# Patient Record
Sex: Female | Born: 1990 | Race: Black or African American | Hispanic: No | Marital: Single | Smoking: Former smoker
Health system: Southern US, Community
[De-identification: ages and names within clinical notes are randomized; demographics above are authoritative.]

## PROBLEM LIST (undated history)

## (undated) HISTORY — PX: WISDOM TOOTH EXTRACTION: SHX21

---

## 2017-05-16 ENCOUNTER — Ambulatory Visit (HOSPITAL_COMMUNITY)
Admission: EM | Admit: 2017-05-16 | Discharge: 2017-05-16 | Disposition: A | Discharge status: Home / Self Care | Payer: Self-pay | Attending: Family Medicine | Admitting: Family Medicine

## 2017-05-16 ENCOUNTER — Encounter (HOSPITAL_COMMUNITY): Payer: Self-pay | Admitting: *Deleted

## 2017-05-16 DIAGNOSIS — Z87891 Personal history of nicotine dependence: Secondary | ICD-10-CM | POA: Insufficient documentation

## 2017-05-16 DIAGNOSIS — N939 Abnormal uterine and vaginal bleeding, unspecified: Secondary | ICD-10-CM | POA: Insufficient documentation

## 2017-05-16 LAB — POCT PREGNANCY, URINE: Preg Test, Ur: NEGATIVE

## 2017-05-16 MED ORDER — MEGESTROL ACETATE 40 MG PO TABS: 40.0000 mg | ORAL_TABLET | Freq: Two times a day (BID) | ORAL | 0 refills | Status: DC

## 2017-05-16 NOTE — ED Triage Notes (Signed)
C/O vaginal bleeding since 5/31 daily; on 6/5 started becoming heavier.  Denies any changes in birth control.  C/O occasional slight cramping.

## 2017-05-16 NOTE — ED Provider Notes (Signed)
CSN: [id number]     Arrival date & time 05/16/17  1939 History   None    Chief Complaint  Patient presents with  . Vaginal Bleeding   (Consider location/radiation/quality/duration/timing/severity/associated sxs/prior Treatment) C/o vaginal bleeding that is heavy for 14 days.  She is having to use a diaper to help stop bleeding.  She denies any pelvic pain.  She denies vaginal discharge.   The history is provided by the patient.  Vaginal Bleeding  Quality:  Dark red and clots Severity:  Severe Onset quality:  Gradual Duration:  14 days Timing:  Constant Progression:  Worsening Chronicity:  New Menstrual history:  Irregular Possible pregnancy: no     History reviewed. No pertinent past medical history. Past Surgical History:  Procedure Laterality Date  . WISDOM TOOTH EXTRACTION     No family history on file. Social History  Substance Use Topics  . Smoking status: Former Games developermoker  . Smokeless tobacco: Never Used  . Alcohol use No   OB History    No data available     Review of Systems  Constitutional: Negative.   HENT: Negative.   Eyes: Negative.   Respiratory: Negative.   Cardiovascular: Negative.   Gastrointestinal: Negative.   Endocrine: Negative.   Genitourinary: Positive for vaginal bleeding.  Musculoskeletal: Negative.   Allergic/Immunologic: Negative.   Neurological: Negative.   Hematological: Negative.   Psychiatric/Behavioral: Negative.     Allergies  Patient has no known allergies.  Home Medications   Prior to Admission medications   Medication Sig Start Date End Date Taking? Authorizing Provider  megestrol (MEGACE) 40 MG tablet Take 1 tablet (40 mg total) by mouth 2 (two) times daily. 05/16/17   Deatra Canterxford, Kruze Atchley J, FNP   Meds Ordered and Administered this Visit  Medications - No data to display  BP 116/71   Pulse (!) 58   Temp 98.5 F (36.9 C) (Oral)   Resp 16   LMP 05/02/2017 (Exact Date)   SpO2 100%  No data found.   Physical Exam   Constitutional: She appears well-developed and well-nourished.  HENT:  Head: Normocephalic and atraumatic.  Eyes: Conjunctivae and EOM are normal. Pupils are equal, round, and reactive to light.  Neck: Normal range of motion. Neck supple.  Cardiovascular: Normal rate, regular rhythm and normal heart sounds.   Pulmonary/Chest: Effort normal and breath sounds normal.  Abdominal: Soft. Bowel sounds are normal.  Genitourinary:  Genitourinary Comments: BUS - Wnl Vagina - blood in vault Cervix - Blood coming from OS No CMT or adnexal tenderness.  Nursing note and vitals reviewed.   Urgent Care Course     Procedures (including critical care time)  Labs Review Labs Reviewed  POCT PREGNANCY, URINE  CERVICOVAGINAL ANCILLARY ONLY    Imaging Review No results found.   Visual Acuity Review  Right Eye Distance:   Left Eye Distance:   Bilateral Distance:    Right Eye Near:   Left Eye Near:    Bilateral Near:         MDM   1. Vaginal bleeding    Megace 40mg  po bid x 10 days #20  [facility]  Endocervical Cytology GC/ Chlamydia trich BV and Candidiasis.      Deatra CanterOxford, Jaimie Pippins J, [place]NP 05/16/17 2041

## 2017-05-17 LAB — CERVICOVAGINAL ANCILLARY ONLY
Bacterial vaginitis: POSITIVE — AB
Candida vaginitis: NEGATIVE
Chlamydia: NEGATIVE
Neisseria Gonorrhea: NEGATIVE
Trichomonas: NEGATIVE

## 2017-05-22 ENCOUNTER — Other Ambulatory Visit: Payer: Self-pay | Admitting: *Deleted

## 2017-05-22 ENCOUNTER — Telehealth: Payer: Self-pay | Admitting: *Deleted

## 2017-05-22 DIAGNOSIS — N939 Abnormal uterine and vaginal bleeding, unspecified: Secondary | ICD-10-CM

## 2017-05-22 NOTE — Telephone Encounter (Signed)
Per Dr Macon LargeAnyanwu, scheduled pelvic u/s for 6/28 @ 0800. Patient was called and notified. Understanding voiced.

## 2017-05-23 ENCOUNTER — Telehealth (HOSPITAL_COMMUNITY): Payer: Self-pay | Admitting: Emergency Medicine

## 2017-05-23 MED ORDER — METRONIDAZOLE 500 MG PO TABS: 500.0000 mg | ORAL_TABLET | Freq: Two times a day (BID) | ORAL | 0 refills | Status: DC

## 2017-05-23 NOTE — Telephone Encounter (Signed)
Called pt and notified of recent lab results Pt ID'd properly... Reports feeling better but still having pelvic pain and slight vag d/c Pt would like for us to call in Flagyl to [facility] E-Rx med to pharmacy Adv pt if sx are not getting better to return or to f/u w/PCP Education on safe sex given Notified pt that lab results can be obtained through MyChart Pt verb understanding.

## 2017-05-23 NOTE — Telephone Encounter (Signed)
-----   Message from Eustace MooreLaura W Murray, MD sent at 05/19/2017 12:20 PM EDT ----- Please let patient know that test for gardnerella (bacterial vaginosis) was positive.  This only needs to be treated if there are symptoms, such as vaginal irritation/discharge.  If these symptoms are present, ok to send rx for metronidazole 500mg  bid x 7d #14 no refills or metronidazole vaginal gel 0.75% 1 applicatorful bid x 7d #14 no refills.  [facility] as discussed at urgent care visit 6/14, for menorrhagia.  LM

## 2017-05-30 ENCOUNTER — Ambulatory Visit (HOSPITAL_COMMUNITY)
Admission: RE | Admit: 2017-05-30 | Discharge: 2017-05-30 | Disposition: A | Discharge status: Home / Self Care | Payer: Self-pay | Source: Ambulatory Visit | Attending: Obstetrics & Gynecology | Admitting: Obstetrics & Gynecology

## 2017-05-30 DIAGNOSIS — N939 Abnormal uterine and vaginal bleeding, unspecified: Secondary | ICD-10-CM | POA: Insufficient documentation

## 2017-05-31 ENCOUNTER — Telehealth: Payer: Self-pay | Admitting: General Practice

## 2017-05-31 NOTE — Telephone Encounter (Signed)
Called patient, no answer- left message to call us back concerning non urgent results. Will send letter

## 2017-05-31 NOTE — Telephone Encounter (Signed)
-----   Message from Tereso NewcomerUgonna A Anyanwu, MD sent at 05/30/2017 10:50 AM EDT ----- Normal pelvic ultrasound. Will discuss management of her AUB with Dr. Alysia PennaErvin on 06/13/17 appointment. Please call to inform patient of results and recommendations.

## 2017-06-13 ENCOUNTER — Ambulatory Visit (INDEPENDENT_AMBULATORY_CARE_PROVIDER_SITE_OTHER): Payer: Self-pay | Admitting: Obstetrics and Gynecology

## 2017-06-13 VITALS — BP 124/69 | HR 69 | Wt 226.0 lb

## 2017-06-13 DIAGNOSIS — N920 Excessive and frequent menstruation with regular cycle: Secondary | ICD-10-CM

## 2017-06-13 DIAGNOSIS — F329 Major depressive disorder, single episode, unspecified: Secondary | ICD-10-CM

## 2017-06-13 DIAGNOSIS — F419 Anxiety disorder, unspecified: Secondary | ICD-10-CM

## 2017-06-13 MED ORDER — DESOGESTREL-ETHINYL ESTRADIOL 0.15-30 MG-MCG PO TABS: 1.0000 | ORAL_TABLET | Freq: Every day | ORAL | 5 refills | Status: AC

## 2017-06-13 NOTE — BH Specialist Note (Deleted)
Integrated Behavioral Health Initial Visit  MRN: [id number] Name: Tara Palmer   Session Start time: 1:*** Session End time: 2:*** Total time: {IBH Total Time:21014050}  Type of Service: Integrated Behavioral Health- Individual/Family Interpretor:No. Interpretor Name and Language: n/a   Warm Hand Off Completed.       SUBJECTIVE: Tara Palmer is a 26 y.o. female accompanied by patient. Patient was referred by Dr Alysia PennaErvin for depression, anxiety. Patient reports the following symptoms/concerns: *** Duration of problem: ***; Severity of problem: severe  OBJECTIVE: Mood: {BHH MOOD:22306} and Affect: {BHH AFFECT:22307} Risk of harm to self or others: {CHL AMB BH Suicide Current Mental Status:21022748}   LIFE CONTEXT: Family and Social: *** School/Work: *** Self-Care: *** Life Changes: ***  GOALS ADDRESSED: Patient will reduce symptoms of: {IBH Symptoms:21014056} and increase knowledge and/or ability of: {IBH Patient Tools:21014057} and also: {IBH Goals:21014053}   INTERVENTIONS: {IBH Interventions:21014054}  Standardized Assessments completed: {IBH Screening Tools:21014051}  ASSESSMENT: Patient currently experiencing ***. Patient may benefit from ***.  PLAN: 1. Follow up with behavioral health clinician on : *** 2. Behavioral recommendations: *** 3. Referral(s): {IBH Referrals:21014055} 4. "From scale of 1-10, how likely are you to follow plan?": ***  Rae LipsJamie C McMannes, LCSWA  Dep[facility] screen [facility] 06/13/2017  Decreased Interest 3  Down, Depressed, Hopeless 3  PHQ - 2 Score 6  Altered sleeping 3  Tired, decreased energy 3  Change in appetite 3  Feeling bad or failure about yourself  3  Trouble concentrating 3  Moving slowly or fidgety/restless 0  Suicidal thoughts 0  PHQ-9 Score 21   GAD 7 : Generalized Anxiety Score 06/13/2017  Nervous, Anxious, on Edge 3  Control/stop worrying 3  Worry too much - different things 3  Trouble relaxing 3  Restless 3   Easily annoyed or irritable 3  Afraid - awful might happen 3  Total GAD 7 Score 21

## 2017-06-13 NOTE — Progress Notes (Signed)
History of Present Illness   Patient Identification Tara Palmer is a [age] y.o. female.  Patient information was obtained from patient. History/Exam limitations: none.  Chief Complaint  New Patient (Initial Visit) (US RESULTS)  Tara Palmer is presenting to follow up on an US performed due to AUB. US was normal and showed normal enometrium w/o evidence of fibroids.  Since her visit to the ER her bleeding has not changed she is still having heavy flow and passing clots, the symptoms have not improved but also haven't worsened. She is currently using a baby diaper as pads are not enough to contain the flow. She uses 3 baby diapers per day and they are soaked. Bleeding everyday with heavier flow on some days than others.  Denies dysmenorrhea, and doesn't usually have cramps with periods.  There have been no breaks in flow for the duration of the bleeding episode. She reports fatigue over the last month. She reports that her UPT at ER was negative. The megace prescribed for the bleeding made her fell worse and made the bleeding worse. She tried the megace for 3 days. Denies personal or family hx of these symptoms. Has FHx of fobroids She reports felling hot often. She thought she was having hot flashes. No family of thyroid problems. She has recently lost 60lbs over 16 mo (11/2015-01/2017) as she is prediabetic. Cycles remained regular during this. She smokes cigars  Previous cycles: had regular monthly cycles q28 days lasting 5-7 days  Menarrche: age 26 Bleeding began May 17th 2018 G/P status: G0P0 Type of birth control used - not using any including condoms or coitus interruptus  Taken [employer] Company[employer]'s-no Sexually active- Yes Trying to get pregnant   Review of Systems  Constitutional: Positive for diaphoresis and malaise/fatigue. Negative for chills, fever and weight loss.  Eyes: Positive for photophobia.  Respiratory: Negative.   Cardiovascular: Positive for orthopnea. Negative for chest pain,  palpitations and leg swelling.  Gastrointestinal: Negative.   Genitourinary: Negative.   Musculoskeletal: Negative.   Skin: Negative.   Neurological: Positive for tingling, tremors and weakness.  Endo/Heme/Allergies: Negative.   Psychiatric/Behavioral: Positive for depression. Negative for hallucinations, memory loss, substance abuse and suicidal ideas. The patient is nervous/anxious and has insomnia.      Physical Exam   BP 124/69   Pulse 69   Wt 226 lb (102.5 kg)  General:   alert and cooperative  Heart: regular rate and rhythm, S1, S2 normal, no murmur, click, rub or gallop  Lungs: clear to auscultation bilaterally  Abdomen: soft, non-tender, without masses or organomegaly  Pelvic:    Vulva: normal   Vagina:  normal mucosa   Cervix: Bleeding noted   Uterus: normal size   Adnexa: normal adnexa    Orthostatic BP  Sitting: 131/79 pulse 68 Standing 145/72 pulse 71   Assessment and Plan    [name] is presenting with AUB for 2 months. Current ddx included AUB-I, vs thyroid cause. Given hx and previously normal cycles she likely has a hormonal cause of AUB. US rules out structural cause of AUB. -Options to control bleeding were reviewed with the pt and she opted to use OCP's to control bleeding with the intent to discontinue after bleeding is controlled to resume efforts to become pregnant.  -TSH performed to look for derangements  -CBC will be checked to look for anemia  -pt was advised to begin prenatal vitamins for Fe and folate.

## 2017-06-13 NOTE — Patient Instructions (Signed)
Dysfunctional Uterine Bleeding °Dysfunctional uterine bleeding is abnormal bleeding from the uterus. Dysfunctional uterine bleeding includes: °· A period that comes earlier or later than usual. °· A period that is lighter, heavier, or has blood clots. °· Bleeding between periods. °· Skipping one or more periods. °· Bleeding after sexual intercourse. °· Bleeding after menopause. ° °Follow these instructions at home: °Pay attention to any changes in your symptoms. Follow these instructions to help with your condition: °Eating and drinking °· Eat well-balanced meals. Include foods that are high in iron, such as liver, meat, shellfish, green leafy vegetables, and eggs. °· If you become constipated: °? Drink plenty of water. °? Eat fruits and vegetables that are high in water and fiber, such as spinach, carrots, raspberries, apples, and mango. °Medicines °· Take over-the-counter and prescription medicines only as told by your health care provider. °· Do not change medicines without talking with your health care provider. °· Aspirin or medicines that contain aspirin may make the bleeding worse. Do not take those medicines: °? During the week before your period. °? During your period. °· If you were prescribed iron pills, take them as told by your health care provider. Iron pills help to replace iron that your body loses because of this condition. °Activity °· If you need to change your sanitary pad or tampon more than one time every 2 hours: °? Lie in bed with your feet raised (elevated). °? Place a cold pack on your lower abdomen. °? Rest as much as possible until the bleeding stops or slows down. °· Do not try to lose weight until the bleeding has stopped and your blood iron level is back to normal. °Other Instructions °· For two months, write down: °? When your period starts. °? When your period ends. °? When any abnormal bleeding occurs. °? What problems you notice. °· Keep all follow up visits as told by your health  care provider. This is important. °Contact a health care provider if: °· You get light-headed or weak. °· You have nausea and vomiting. °· You cannot eat or drink without vomiting. °· You feel dizzy or have diarrhea while you are taking medicines. °· You are taking birth control pills or hormones, and you want to change them or stop taking them. °Get help right away if: °· You develop a fever or chills. °· You need to change your sanitary pad or tampon more than one time per hour. °· Your bleeding becomes heavier, or your flow contains clots more often. °· You develop pain in your abdomen. °· You lose consciousness. °· You develop a rash. °This information is not intended to replace advice given to you by your health care provider. Make sure you discuss any questions you have with your health care provider. °Document Released: 11/16/2000 Document Revised: 04/26/2016 Document Reviewed: 02/14/2015 °Elsevier Interactive Patient Education © 2018 Elsevier Inc. ° °

## 2017-06-14 LAB — CBC
Hematocrit: 35.8 % (ref 34.0–46.6)
Hemoglobin: 11 g/dL — ABNORMAL LOW (ref 11.1–15.9)
MCH: 23.2 pg — ABNORMAL LOW (ref 26.6–33.0)
MCHC: 30.7 g/dL — ABNORMAL LOW (ref 31.5–35.7)
MCV: 76 fL — ABNORMAL LOW (ref 79–97)
Platelets: 280 10*3/uL (ref 150–379)
RBC: 4.74 x10E6/uL (ref 3.77–5.28)
RDW: 15.5 % — ABNORMAL HIGH (ref 12.3–15.4)
WBC: 8.8 10*3/uL (ref 3.4–10.8)

## 2017-06-14 LAB — TSH: TSH: 1.64 u[IU]/mL (ref 0.450–4.500)

## 2017-10-04 IMAGING — US US PELVIS COMPLETE
1 series · 15 of 25 positions shown · non-contrast
Comparison: None

CLINICAL DATA: Abnormal uterine bleeding for 1 month.  Pelvic pain.

EXAM:
TRANSABDOMINAL AND TRANSVAGINAL ULTRASOUND OF PELVIS
TECHNIQUE: Both transabdominal and transvaginal ultrasound examinations of the
pelvis were performed. Transabdominal technique was performed for
global imaging of the pelvis including uterus, ovaries, adnexal
regions, and pelvic cul-de-sac. It was necessary to proceed with
endovaginal exam following the transabdominal exam to visualize the
uterus, endometrium, ovaries and adnexa .

[Series 1: us pelvis complete · 15 of 79 slices shown]
[im 1/79]
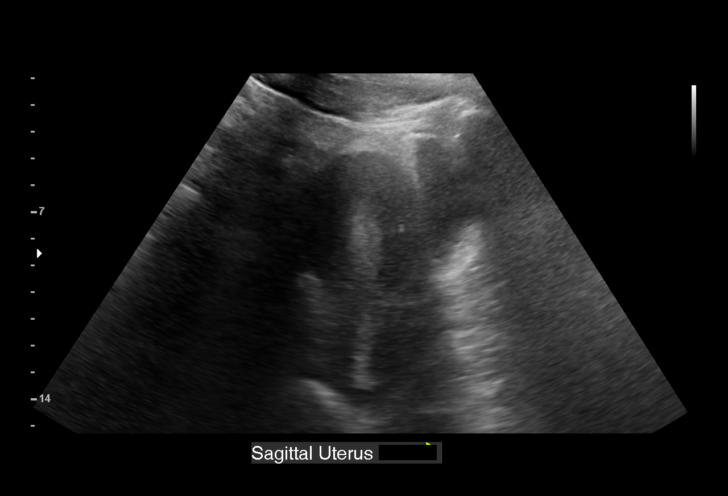
[im 7/79]
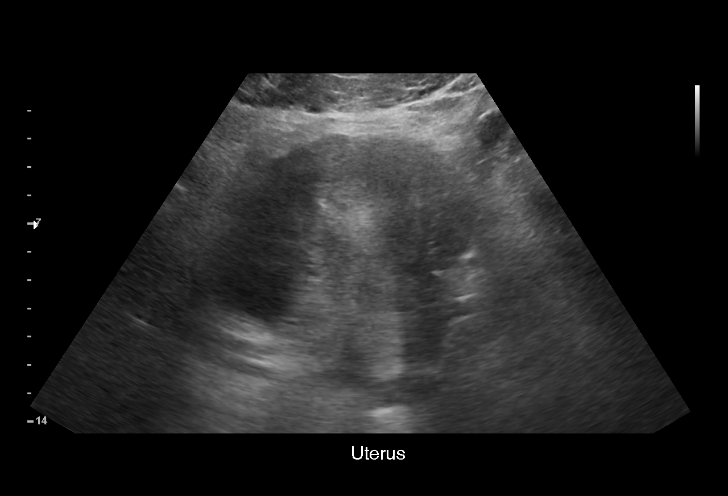
[im 14/79]
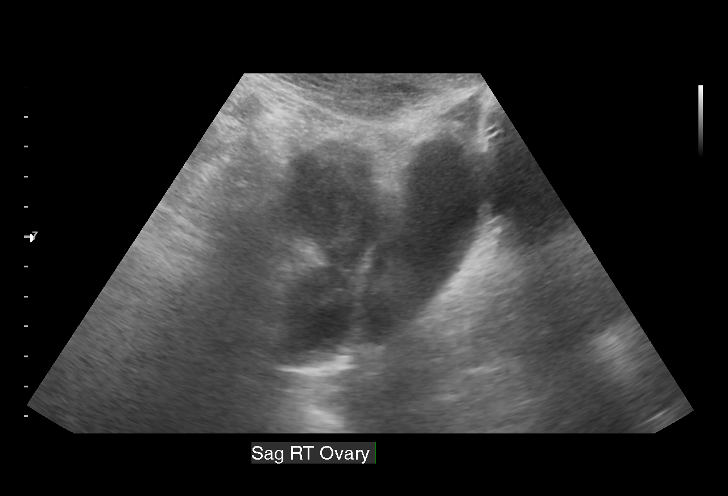
[im 17/79]
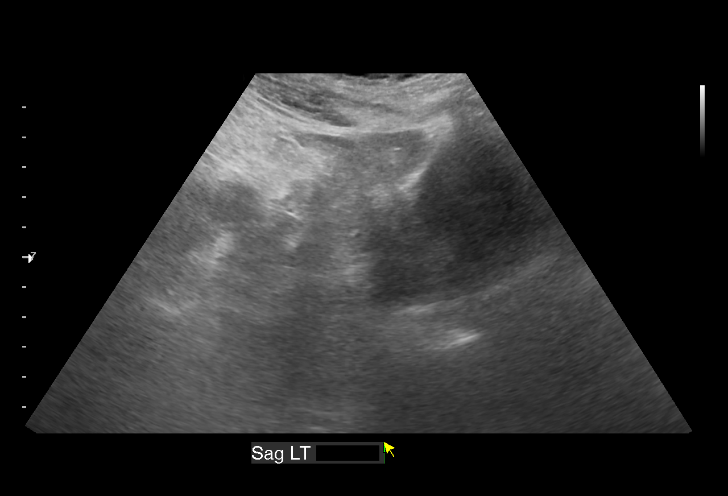
[im 23/79]
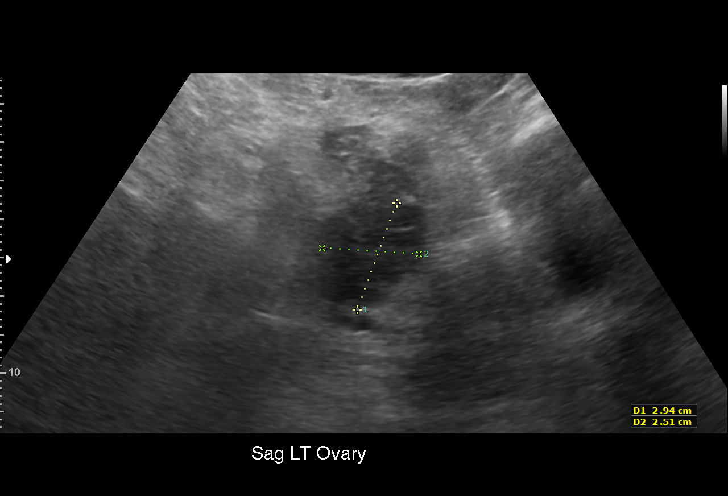
[im 30/79]
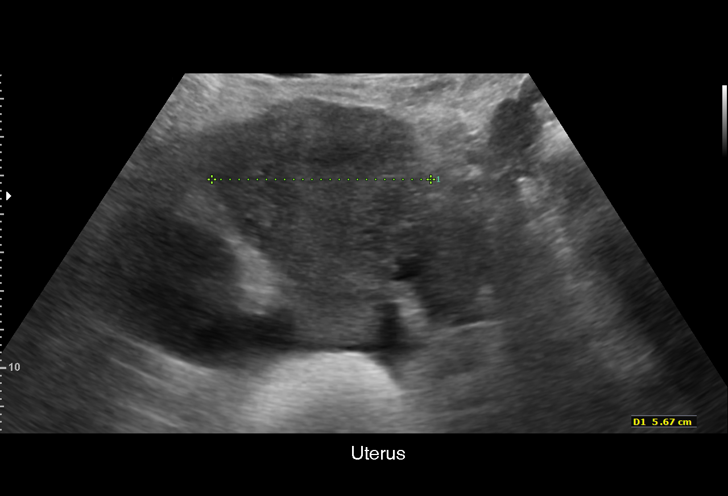
[im 33/79]
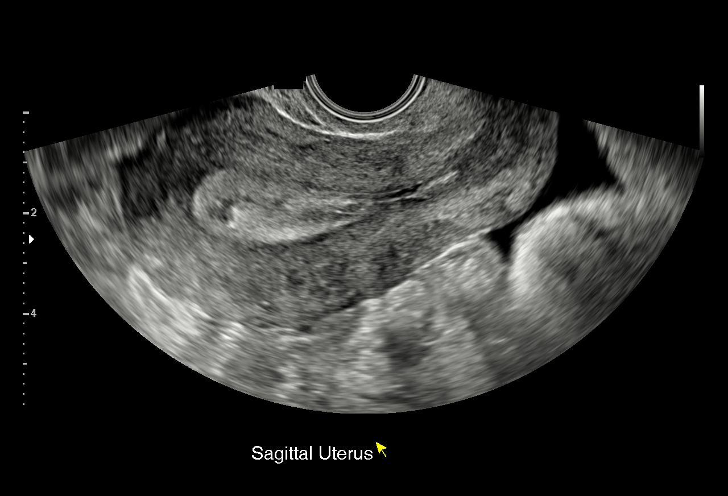
[im 40/79]
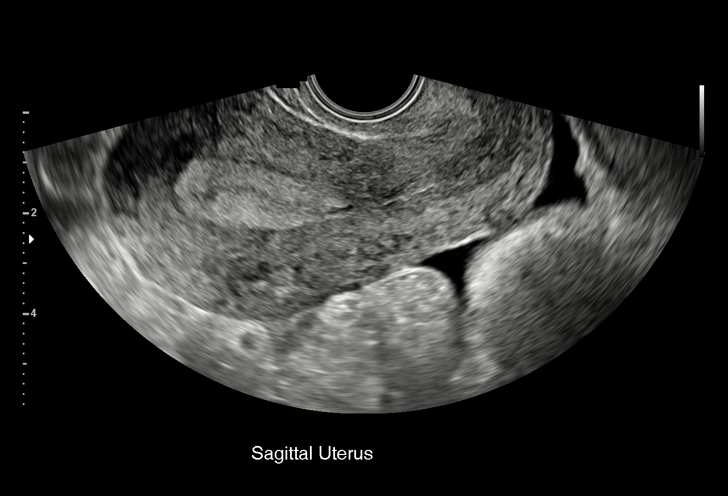
[im 46/79]
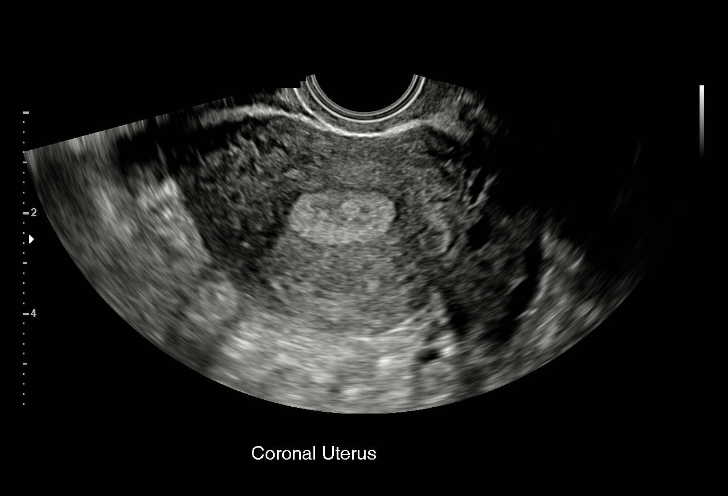
[im 49/79]
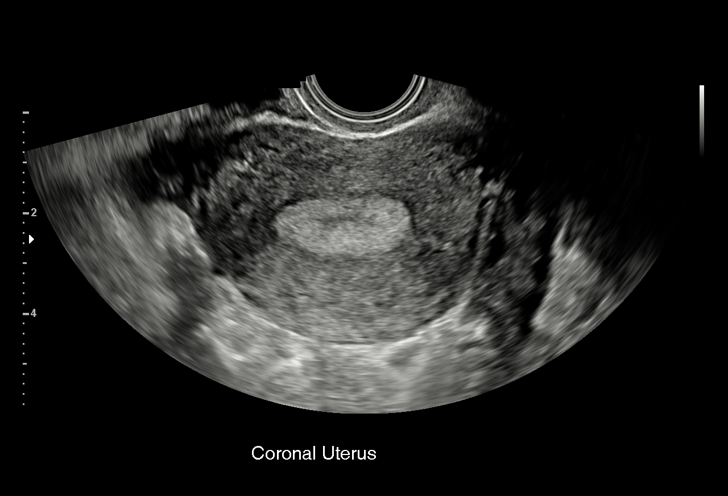
[im 56/79]
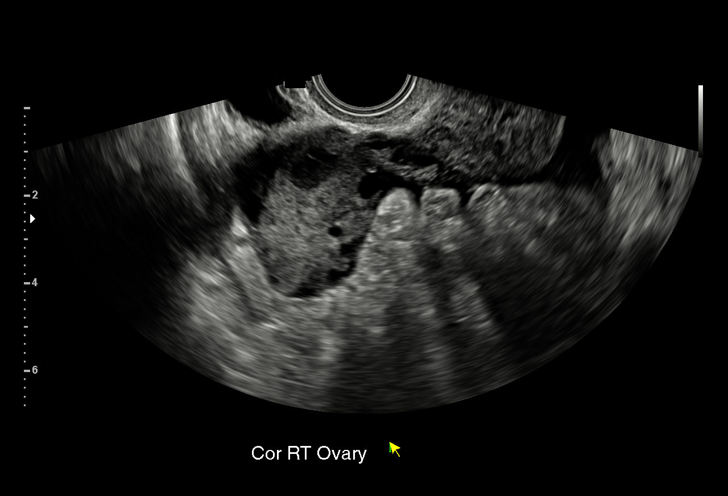
[im 62/79]
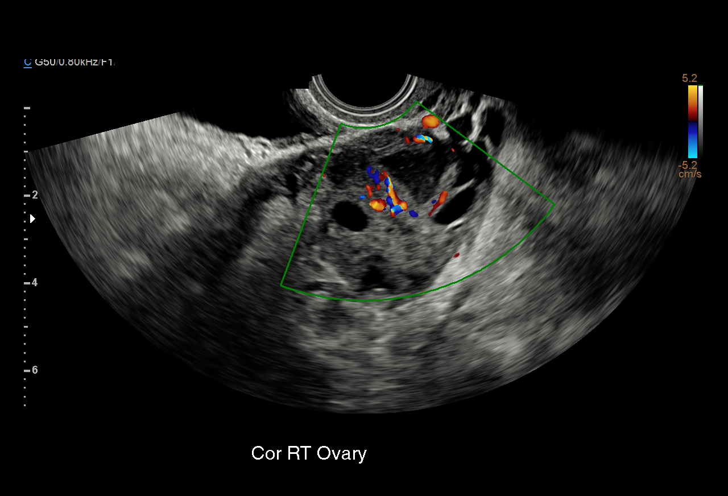
[im 66/79]
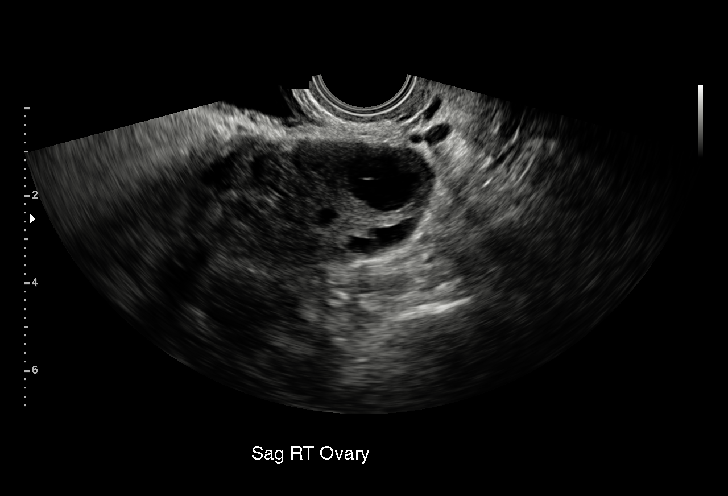
[im 72/79]
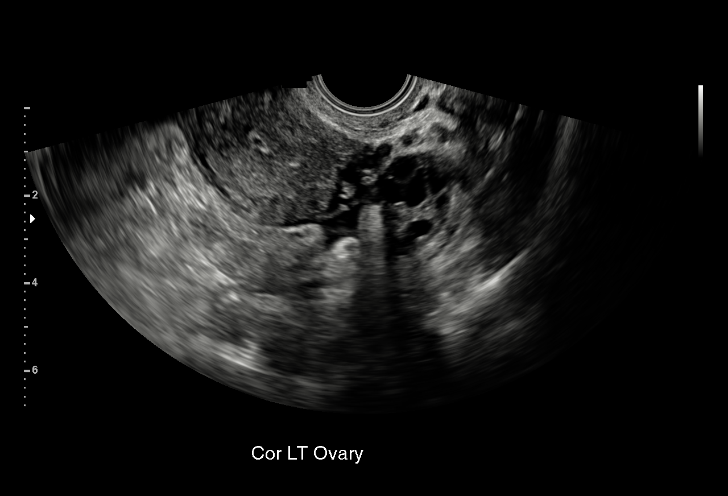
[im 79/79]
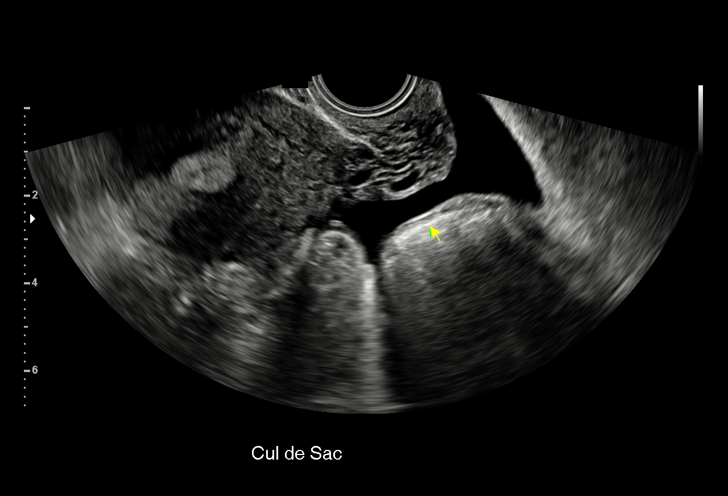

[15 of 25 positions shown; findings below may reference images not displayed]

FINDINGS: Uterus

Measurements: 9.6 x 4.6 x 6.1 cm. No fibroids or other mass
visualized.

Endometrium

Thickness: 14 mm in thickness.  No focal abnormality visualized.

Right ovary

Measurements: 3.9 x 4.1 x 3.4 cm. Normal appearance/no adnexal mass.

Left ovary

Measurements: 3.9 x 2.6 x 2.0 cm. Normal appearance/no adnexal mass.

Other findings

No abnormal free fluid.
IMPRESSION: Unremarkable pelvic ultrasound. If bleeding remains unresponsive to
hormonal or medical therapy, sonohysterogram should be considered
for focal lesion work-up. (Ref: Radiological Reasoning: Algorithmic
Workup of Abnormal Vaginal Bleeding with Endovaginal Sonography and
Sonohysterography. AJR 5225; 191:S68-73)

## 2021-03-01 ENCOUNTER — Ambulatory Visit (HOSPITAL_COMMUNITY)
Admission: EM | Admit: 2021-03-01 | Discharge: 2021-03-01 | Disposition: A | Discharge status: Home / Self Care | Payer: Self-pay

## 2021-03-01 ENCOUNTER — Other Ambulatory Visit: Payer: Self-pay

## 2021-03-01 NOTE — ED Notes (Signed)
Per registration staff, patient decided not to stay for visit.

## 2021-03-02 ENCOUNTER — Ambulatory Visit (HOSPITAL_COMMUNITY): Payer: Self-pay

## 2021-03-08 ENCOUNTER — Encounter (HOSPITAL_COMMUNITY): Payer: Self-pay

## 2021-03-08 ENCOUNTER — Other Ambulatory Visit: Payer: Self-pay

## 2021-03-08 ENCOUNTER — Ambulatory Visit (HOSPITAL_COMMUNITY)
Admission: EM | Admit: 2021-03-08 | Discharge: 2021-03-08 | Disposition: A | Discharge status: Home / Self Care | Payer: Medicaid Other

## 2021-03-08 DIAGNOSIS — M722 Plantar fascial fibromatosis: Secondary | ICD-10-CM

## 2021-03-08 DIAGNOSIS — M79605 Pain in left leg: Secondary | ICD-10-CM

## 2021-03-08 MED ORDER — PREDNISONE 20 MG PO TABS: 40.0000 mg | ORAL_TABLET | Freq: Every day | ORAL | 0 refills | Status: AC

## 2021-03-08 NOTE — ED Provider Notes (Signed)
[facility]    CSN: [id number] Arrival date & time: 03/08/21  1157      History   Chief Complaint Chief Complaint  Patient presents with  . Leg Pain    HPI Tara Palmer is a 30 y.o. female.   Patient here with 3-day history of left heel pain, left posterior ankle pain going up into calf.  She states she sprained her left ankle 2 weeks ago and has been limping, not walking in her usual fashion and feels that this has contributed to her current symptoms.  She denies swelling, redness, numbness, tingling.  Able to bear weight but is painful to do so.  Has been taking anti-inflammatory pain relievers over-the-counter with mild temporary relief.  States her PCP was unable to give her a work note and she stands and walks all day at work.     History reviewed. No pertinent past medical history.  Patient Active Problem List   Diagnosis Date Noted  . Menorrhagia 06/13/2017    Past Surgical History:  Procedure Laterality Date  . WISDOM TOOTH EXTRACTION      OB History   No obstetric history on file.      Home Medications    Prior to Admission medications   Medication Sig Start Date End Date Taking? Authorizing Provider  ibuprofen (ADVIL) 200 MG tablet Take 200 mg by mouth every 6 (six) hours as needed.   Yes [provider]  predniSONE (DELTASONE) 20 MG tablet Take 2 tablets (40 mg total) by mouth daily with breakfast. 03/08/21  Yes Particia Nearing, PA-C  desogestrel-ethinyl estradiol (APRI) 0.15-30 MG-MCG tablet Take 1 tablet by mouth daily. First package 2 active pills qd until gone and then start new pack as directed 06/13/17   Hermina Staggers, MD    Family History History reviewed. No pertinent family history.  Social History Social History   Tobacco Use  . Smoking status: Former Games developer  . Smokeless tobacco: Never Used  Substance Use Topics  . Alcohol use: No  . Drug use: No     Allergies   Patient has no known  allergies.   Review of Systems Review of Systems Per HPI Physical Exam Triage Vital Signs ED Triage Vitals  Enc Vitals Group     BP 03/08/21 1310 126/84     Pulse Rate 03/08/21 1310 87     Resp 03/08/21 1310 18     Temp 03/08/21 1310 99 F (37.2 C)     Temp Source 03/08/21 1310 Oral     SpO2 03/08/21 1310 97 %     Weight --      Height --      Head Circumference --      Peak Flow --      Pain Score 03/08/21 1307 10     Pain Loc --      Pain Edu? --      Excl. in GC? --    No data found.  Updated Vital Signs BP 126/84 (BP Location: Left Arm)   Pulse 87   Temp 99 F (37.2 C) (Oral)   Resp 18   LMP  (Within Weeks) Comment: 1 week   SpO2 97%   Visual Acuity Right Eye Distance:   Left Eye Distance:   Bilateral Distance:    Right Eye Near:   Left Eye Near:    Bilateral Near:     Physical Exam Vitals and nursing note reviewed.  Constitutional:  Appearance: Normal appearance. She is not ill-appearing.  HENT:     Head: Atraumatic.  Eyes:     Extraocular Movements: Extraocular movements intact.     Conjunctiva/sclera: Conjunctivae normal.  Cardiovascular:     Rate and Rhythm: Normal rate and regular rhythm.     Pulses: Normal pulses.     Heart sounds: Normal heart sounds.  Pulmonary:     Effort: Pulmonary effort is normal.     Breath sounds: Normal breath sounds.  Musculoskeletal:        General: Normal range of motion.     Cervical back: Normal range of motion and neck supple.     Comments: Left Achilles and posterior foot tender to palpation No swelling, discoloration, decreased strength left lower extremity  Skin:    General: Skin is warm and dry.  Neurological:     Mental Status: She is alert and oriented to person, place, and time.     Sensory: No sensory deficit.     Motor: No weakness.     Gait: Gait normal.  Psychiatric:        Mood and Affect: Mood normal.        Thought Content: Thought content normal.        Judgment: Judgment normal.       UC Treatments / Results  Labs (all labs ordered are listed, but only abnormal results are displayed) Labs Reviewed - No data to display  EKG   Radiology No results found.  Procedures Procedures (including critical care time)  Medications Ordered in UC Medications - No data to display  Initial Impression / Assessment and Plan / UC Course  I have reviewed the triage vital signs and the nursing notes.  Pertinent labs & imaging results that were available during my care of the patient were reviewed by me and considered in my medical decision making (see chart for details).     Suspect Achilles tendinitis, plantar fasciitis from limping from ankle sprain.  Discussed RICE protocol, over-the-counter pain relievers, prednisone burst, work note given for rest.  Follow-up with sports medicine if not fully resolving  Final Clinical Impressions(s) / UC Diagnoses   Final diagnoses:  Left leg pain  Plantar fasciitis of left foot   Discharge Instructions   None    ED Prescriptions    Medication Sig Dispense Auth. Provider   predniSONE (DELTASONE) 20 MG tablet Take 2 tablets (40 mg total) by mouth daily with breakfast. 10 tablet Particia Nearing, [place]     PDMP not reviewed this encounter.   Particia Nearing, [place] 03/08/21 1359

## 2021-03-08 NOTE — ED Triage Notes (Signed)
Pt reports pain in left knee radiates to left heel x 1 day. Pt reports she sprained the left ankle 2 weeks ago. Advil gives relief.   Pt needs a note to take to her job, as she works standing up.

## 2021-03-21 ENCOUNTER — Other Ambulatory Visit: Payer: Self-pay | Admitting: Podiatry

## 2021-03-21 ENCOUNTER — Ambulatory Visit (INDEPENDENT_AMBULATORY_CARE_PROVIDER_SITE_OTHER): Payer: Medicaid Other

## 2021-03-21 ENCOUNTER — Encounter: Payer: Self-pay | Admitting: Podiatry

## 2021-03-21 ENCOUNTER — Ambulatory Visit (INDEPENDENT_AMBULATORY_CARE_PROVIDER_SITE_OTHER): Payer: Self-pay | Admitting: Podiatry

## 2021-03-21 ENCOUNTER — Other Ambulatory Visit: Payer: Self-pay

## 2021-03-21 DIAGNOSIS — M722 Plantar fascial fibromatosis: Secondary | ICD-10-CM

## 2021-03-21 DIAGNOSIS — M79672 Pain in left foot: Secondary | ICD-10-CM

## 2021-03-21 MED ORDER — MELOXICAM 15 MG PO TABS: 15.0000 mg | ORAL_TABLET | Freq: Every day | ORAL | 3 refills | Status: AC

## 2021-03-21 NOTE — Patient Instructions (Signed)

## 2021-03-23 ENCOUNTER — Encounter: Payer: Self-pay | Admitting: Podiatry

## 2021-03-23 MED ORDER — DEXAMETHASONE SODIUM PHOSPHATE 4 MG/ML IJ SOLN
2.0000 mg | Freq: Once | INTRAMUSCULAR | Status: AC
  Administered 2021-03-23: 2 mg

## 2021-03-23 MED ORDER — TRIAMCINOLONE ACETONIDE 10 MG/ML IJ SUSP
5.0000 mg | Freq: Once | INTRAMUSCULAR | Status: AC
  Administered 2021-03-23: 5 mg

## 2021-03-23 NOTE — Progress Notes (Signed)
  Subjective:  Patient ID: Tara Palmer, female    DOB: 08-25-1991,  MRN: [id number]  Chief Complaint  Patient presents with  . Foot Pain    Patient states she is on her feet for too long and works as a Sales executive, her toes go numb she has pain in the bottom of her foot.  It started out as ankle pain.    30 y.o. female presents with the above complaint. History confirmed with patient.   Objective:  Physical Exam: warm, good capillary refill, no trophic changes or ulcerative lesions, normal DP and PT pulses and normal sensory exam. Left Foot: She has sharp pain on palpation to the insertion of the plantar fashion on the medial calcaneal tubercle  Radiographs: X-ray of the left foot: no fracture, dislocation, swelling or degenerative changes noted Assessment:   1. Left foot pain   2. Plantar fasciitis of left foot      Plan:  Patient was evaluated and treated and all questions answered.  Discussed the etiology and treatment options for plantar fasciitis including stretching, formal physical therapy, supportive shoegears such as a running shoe or sneaker, pre fabricated orthoses, injection therapy, and oral medications. We also discussed the role of surgical treatment of this for patients who do not improve after exhausting non-surgical treatment options.   Plantar Fasciitis -XR reviewed with patient -Educated patient on stretching and icing of the affected limb -Injection delivered to the plantar fascia of the left foot. -Rx for meloxicam. Educated on use, risks and benefits of the medication -She started a new job and is due to get new insurance, hopefully after this we can work on some DME and physical therapy if needed   Return in about 1 month (around 04/20/2021).

## 2021-04-17 ENCOUNTER — Telehealth: Payer: Self-pay | Admitting: Podiatry

## 2021-04-17 NOTE — Telephone Encounter (Signed)
Patient sent an email via our website contact form stating:  To whom it may concern: My sincerest apologies for missing your calls to confirm. I will not be able to attend my appointment because I can not afford to pay the $200 co pay. I would like to call and reschedule for a later date one my insurance is active with my employer. If you cold please let the Dr know that my foot is it improving. The only time I don't feel pain is when I take the metformin and it wears off quickly. if he could prescribe me something for pain without me coming back I would really appreciate that as I am unable to sleep at night from the pain.  Thanks, Tara Palmer

## 2021-04-18 ENCOUNTER — Ambulatory Visit: Payer: Medicaid Other | Admitting: Podiatry
